# Patient Record
Sex: Female | Born: 1970 | Race: White | Hispanic: No | Marital: Married | Smoking: Former smoker
Health system: Southern US, Community
[De-identification: ages and names within clinical notes are randomized; demographics above are authoritative.]

---

## 2003-10-21 HISTORY — PX: CRANIOTOMY: SHX93

## 2018-03-16 ENCOUNTER — Ambulatory Visit
Admission: RE | Admit: 2018-03-16 | Discharge: 2018-03-16 | Disposition: A | Discharge status: Home / Self Care | Payer: BC Managed Care – PPO | Source: Ambulatory Visit | Attending: Nurse Practitioner | Admitting: Nurse Practitioner

## 2018-03-16 ENCOUNTER — Other Ambulatory Visit: Payer: Self-pay | Admitting: Nurse Practitioner

## 2018-03-16 DIAGNOSIS — M79672 Pain in left foot: Secondary | ICD-10-CM

## 2018-04-01 ENCOUNTER — Other Ambulatory Visit: Payer: Self-pay | Admitting: Nurse Practitioner

## 2018-04-01 ENCOUNTER — Ambulatory Visit
Admission: RE | Admit: 2018-04-01 | Discharge: 2018-04-01 | Disposition: A | Discharge status: Home / Self Care | Payer: BC Managed Care – PPO | Source: Ambulatory Visit | Attending: Nurse Practitioner | Admitting: Nurse Practitioner

## 2018-04-01 DIAGNOSIS — M79672 Pain in left foot: Secondary | ICD-10-CM

## 2018-09-21 ENCOUNTER — Other Ambulatory Visit: Payer: Self-pay | Admitting: Internal Medicine

## 2018-09-21 DIAGNOSIS — Z1231 Encounter for screening mammogram for malignant neoplasm of breast: Secondary | ICD-10-CM

## 2018-10-28 ENCOUNTER — Ambulatory Visit
Admission: RE | Admit: 2018-10-28 | Discharge: 2018-10-28 | Disposition: A | Discharge status: Home / Self Care | Payer: BC Managed Care – PPO | Source: Ambulatory Visit | Attending: Internal Medicine | Admitting: Internal Medicine

## 2018-10-28 DIAGNOSIS — Z1231 Encounter for screening mammogram for malignant neoplasm of breast: Secondary | ICD-10-CM

## 2019-01-04 IMAGING — DX DG FOOT COMPLETE 3+V*L*
3 series · 3 of 3 positions shown · non-contrast
Comparison: 03/16/2018

CLINICAL DATA: Follow-up fourth toe fracture, initial encounter

EXAM:
LEFT FOOT - COMPLETE 3+ VIEW

[dg foot complete left (1 of 3)]
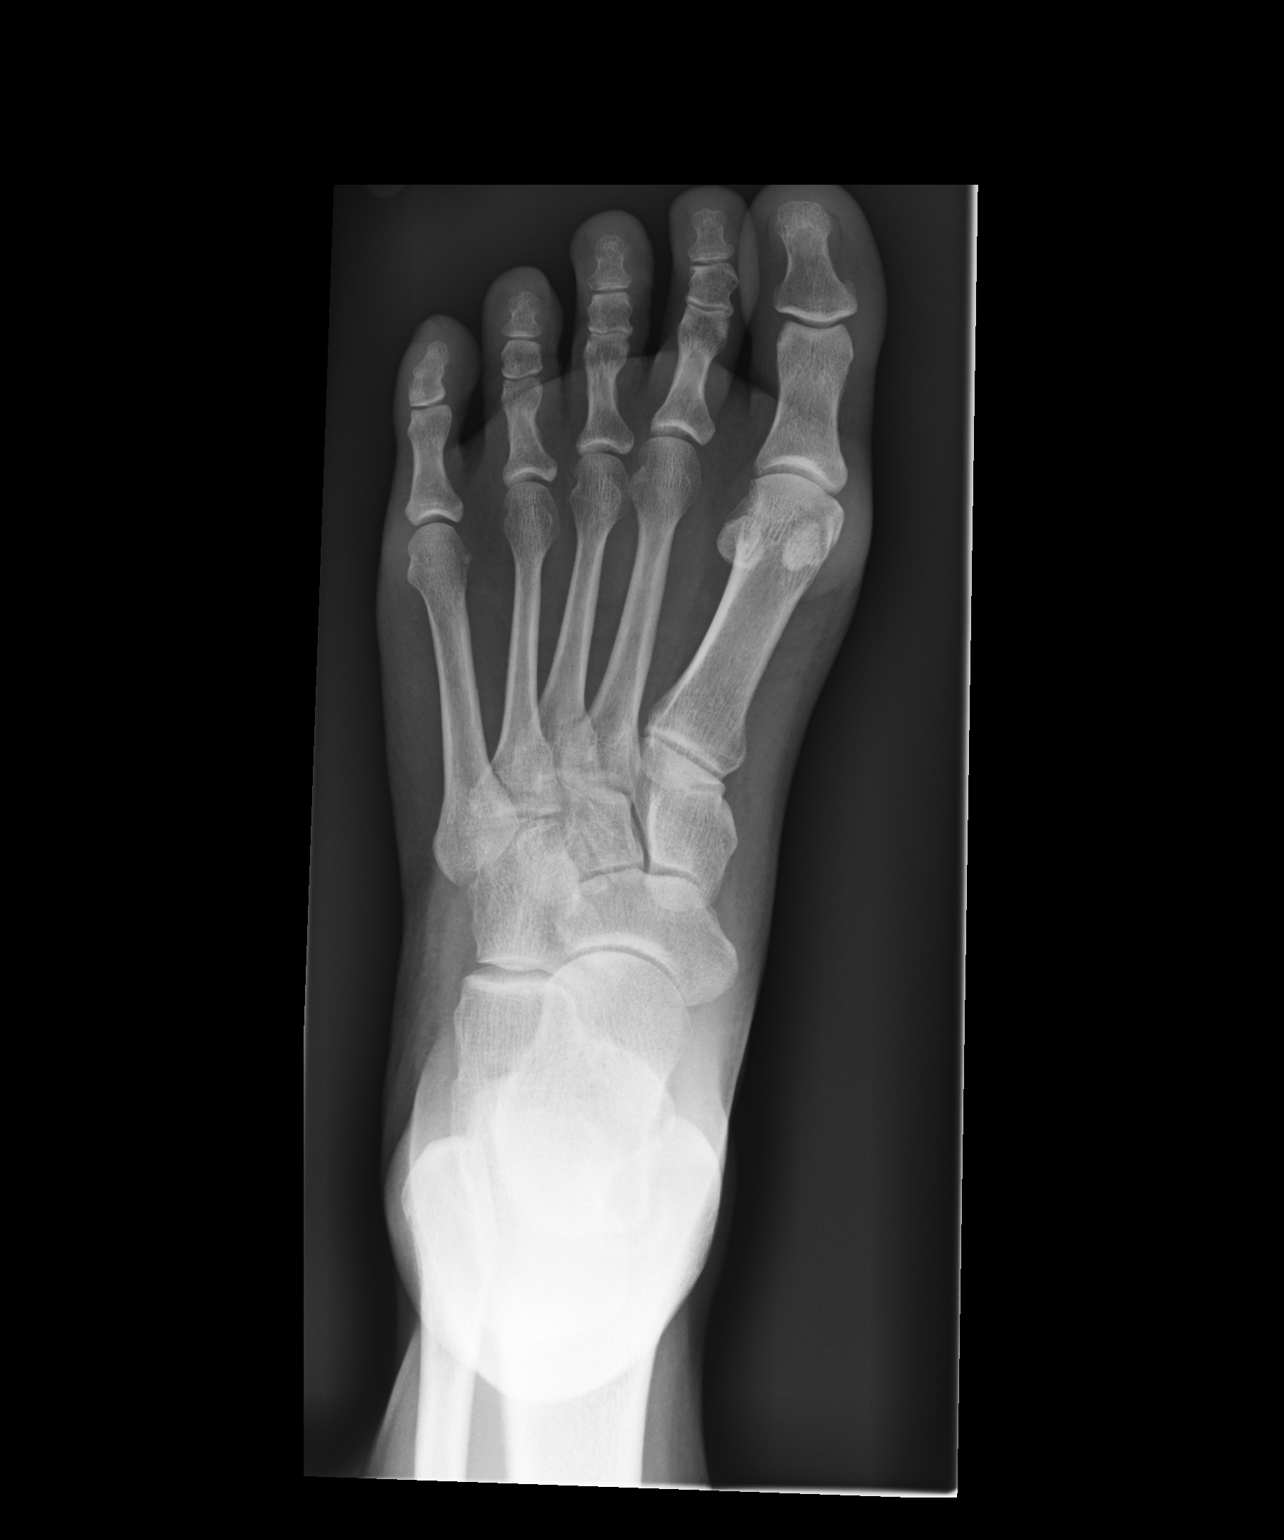

[dg foot complete left (2 of 3)]
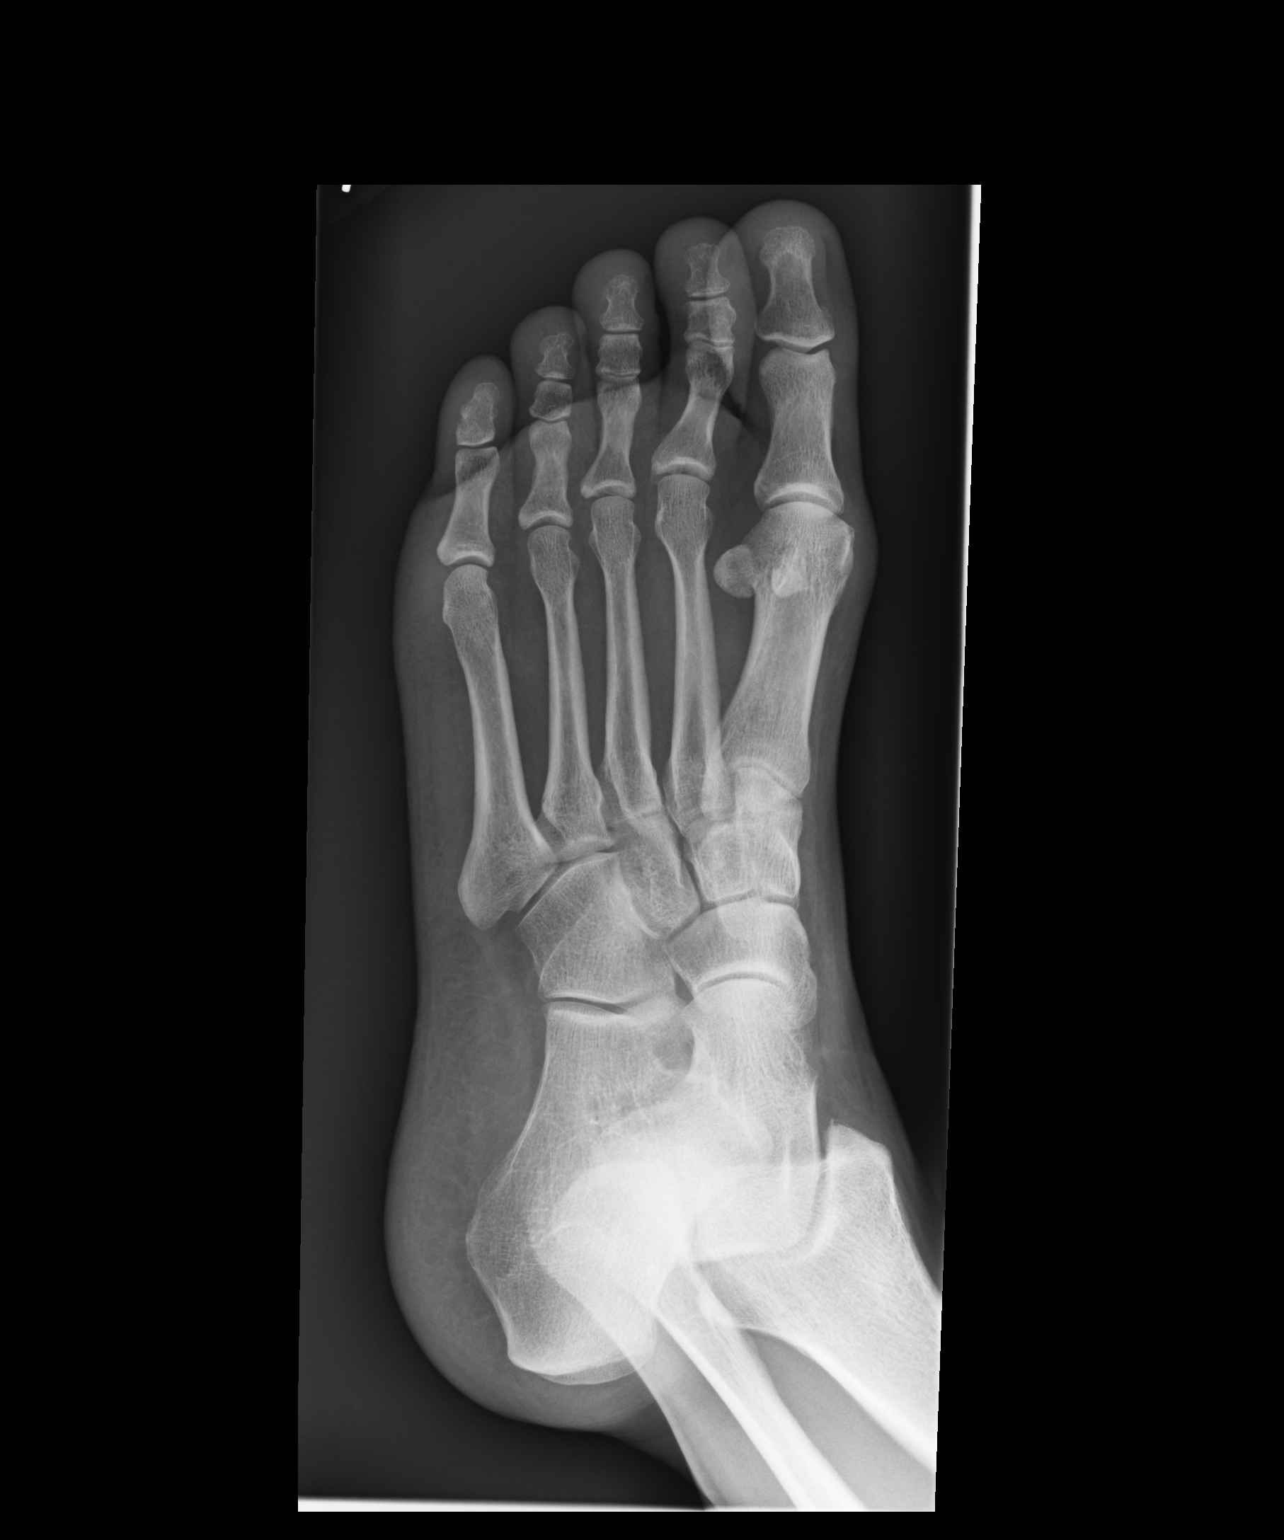

[dg foot complete left (3 of 3)]
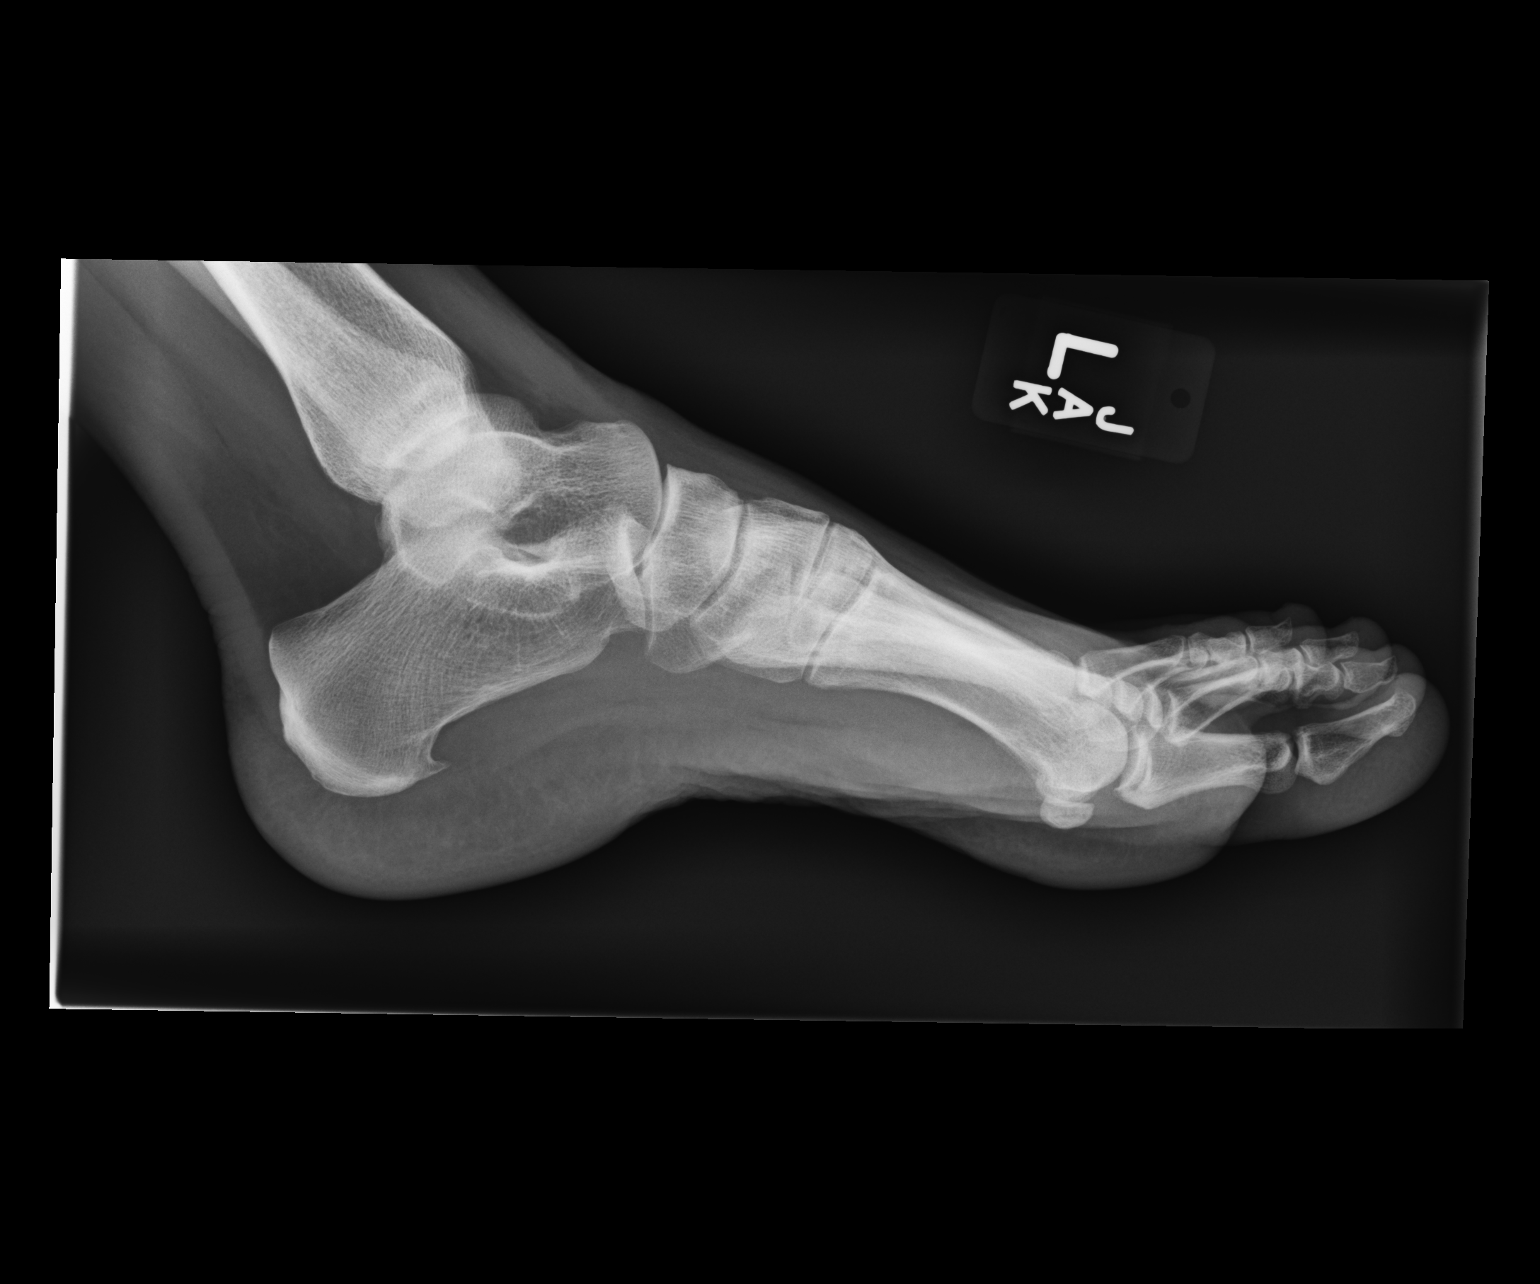

[3 of 3 positions shown; findings below may reference images not displayed]

FINDINGS: Previously seen avulsion from the fourth middle phalanx is faintly
identified. No new bony abnormality is seen. Mild calcaneal spurring
is noted.
IMPRESSION: Previously seen avulsion is again noted and stable.

## 2019-12-01 ENCOUNTER — Other Ambulatory Visit: Payer: Self-pay | Admitting: Obstetrics & Gynecology

## 2019-12-01 ENCOUNTER — Other Ambulatory Visit: Payer: Self-pay | Admitting: Internal Medicine

## 2019-12-01 DIAGNOSIS — Z1231 Encounter for screening mammogram for malignant neoplasm of breast: Secondary | ICD-10-CM

## 2019-12-06 ENCOUNTER — Other Ambulatory Visit: Payer: Self-pay

## 2019-12-06 ENCOUNTER — Ambulatory Visit
Admission: RE | Admit: 2019-12-06 | Discharge: 2019-12-06 | Disposition: A | Discharge status: Home / Self Care | Payer: BC Managed Care – PPO | Source: Ambulatory Visit | Attending: Obstetrics & Gynecology | Admitting: Obstetrics & Gynecology

## 2019-12-06 DIAGNOSIS — Z1231 Encounter for screening mammogram for malignant neoplasm of breast: Secondary | ICD-10-CM

## 2020-09-09 IMAGING — MG DIGITAL SCREENING BILAT W/ TOMO W/ CAD
8 series · 9 of 24 positions shown · non-contrast
Comparison: Previous exam(s).

CLINICAL DATA: Screening.

EXAM:
DIGITAL SCREENING BILATERAL MAMMOGRAM WITH TOMO AND CAD

[L MLO synth-2D]
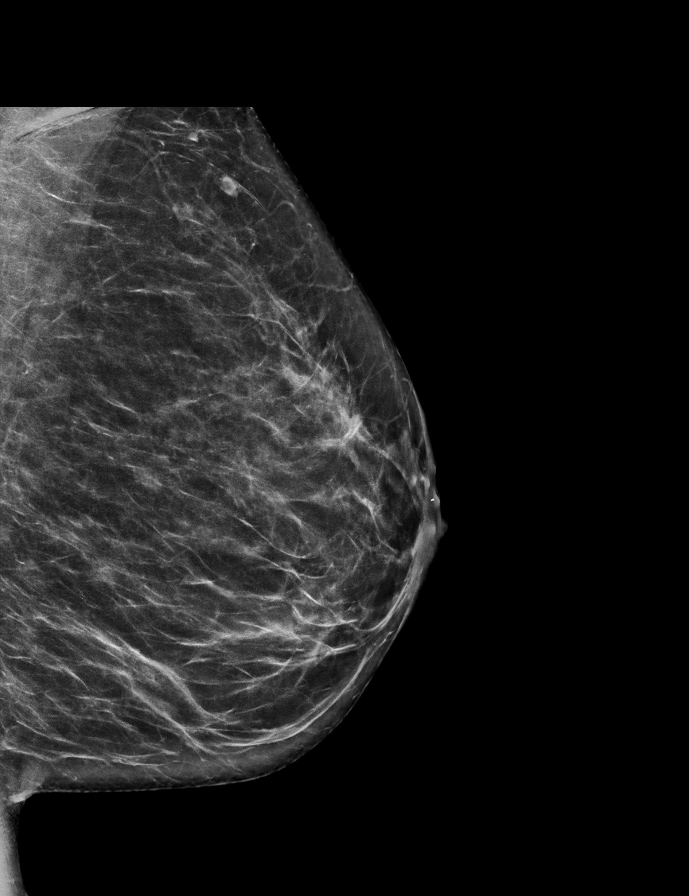

[R MLO synth-2D]
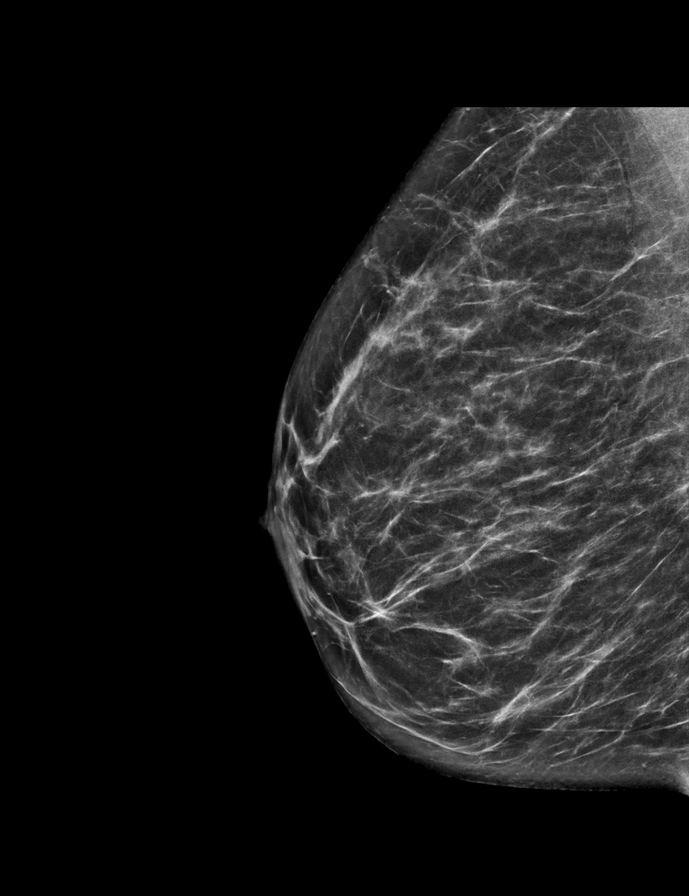

[L CC synth-2D]
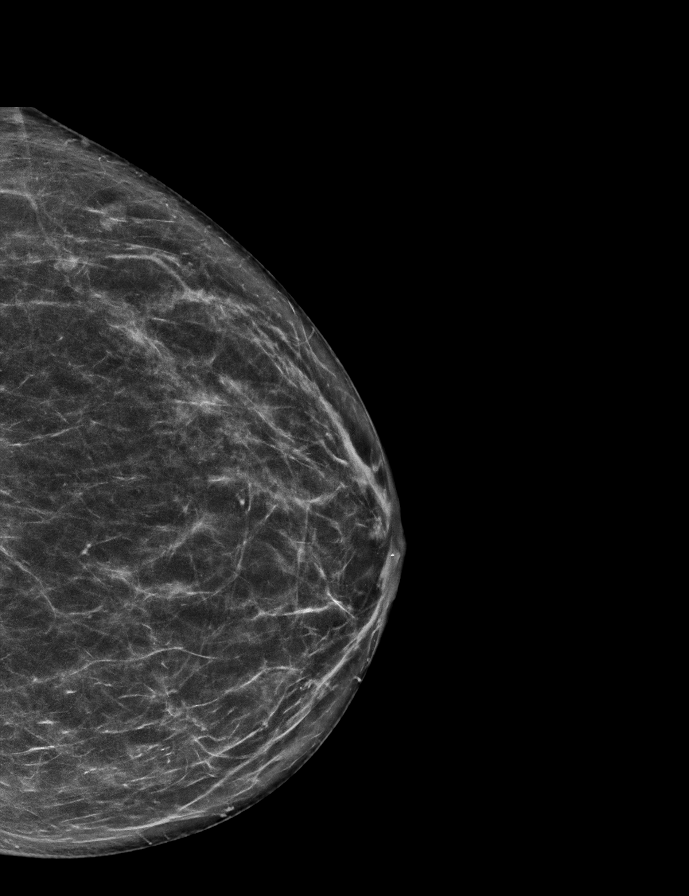

[R CC synth-2D]
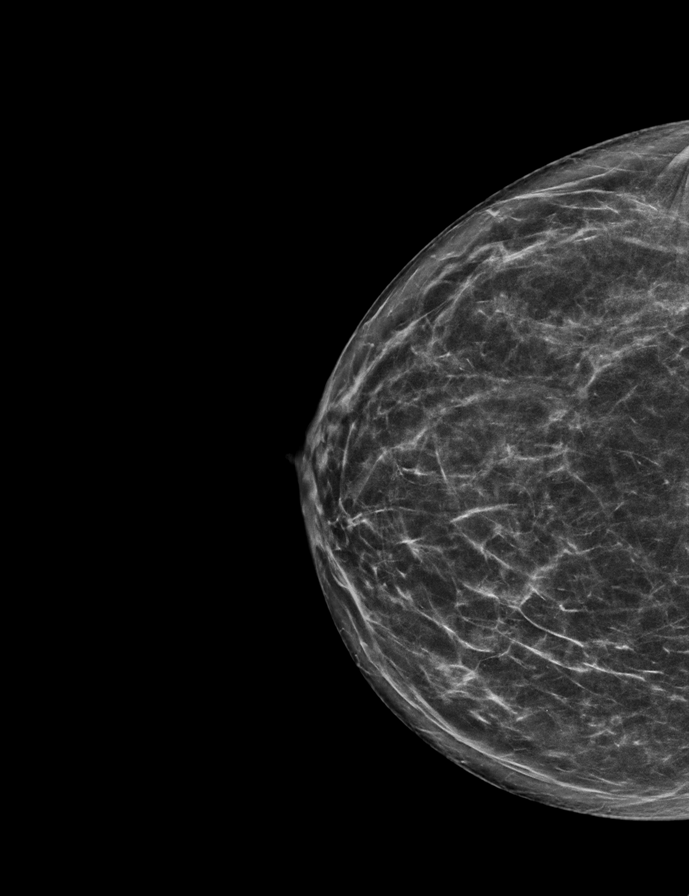

[L CC tomo · 2 of 62 frames shown]
[frame 21/62]
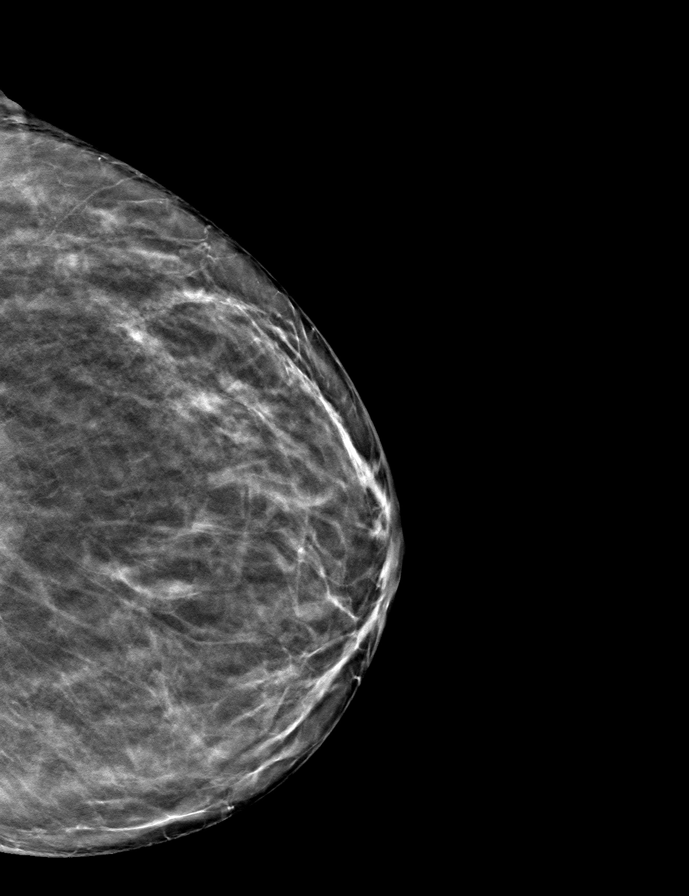
[frame 31/62]
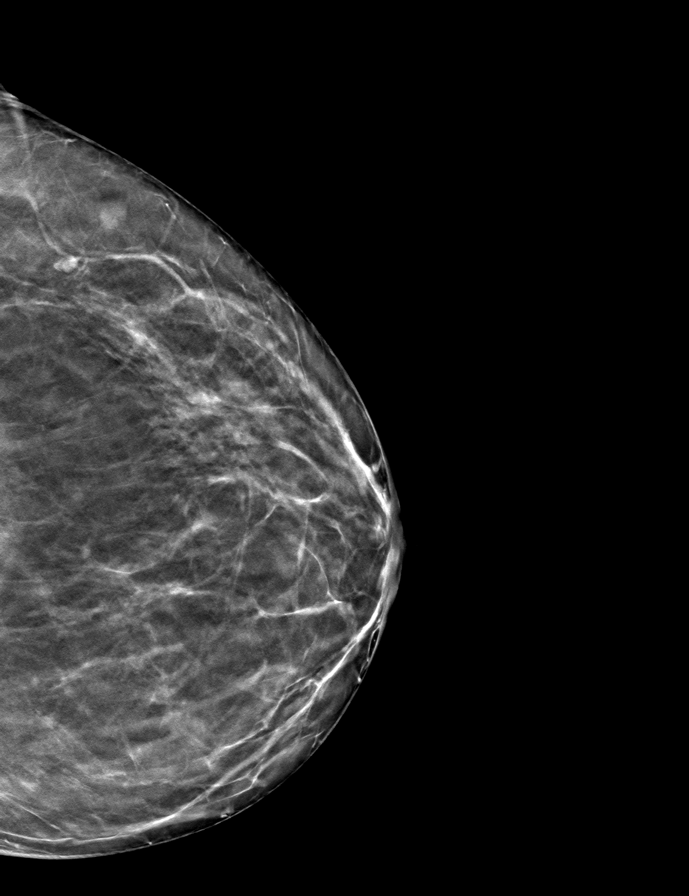

[R MLO tomo · tomo slice 31/61.0]
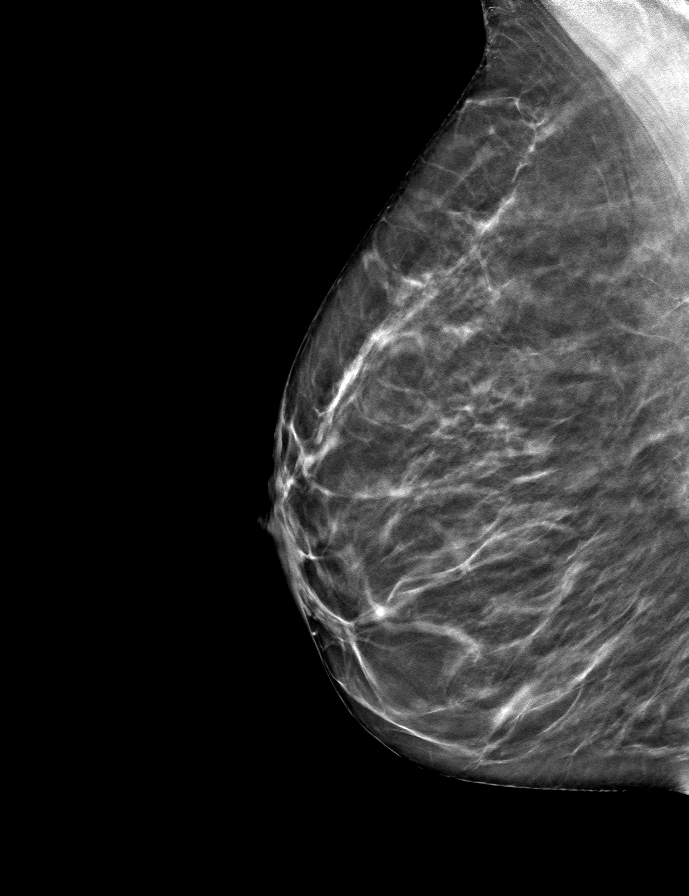

[R CC tomo · tomo slice 33/64.0]
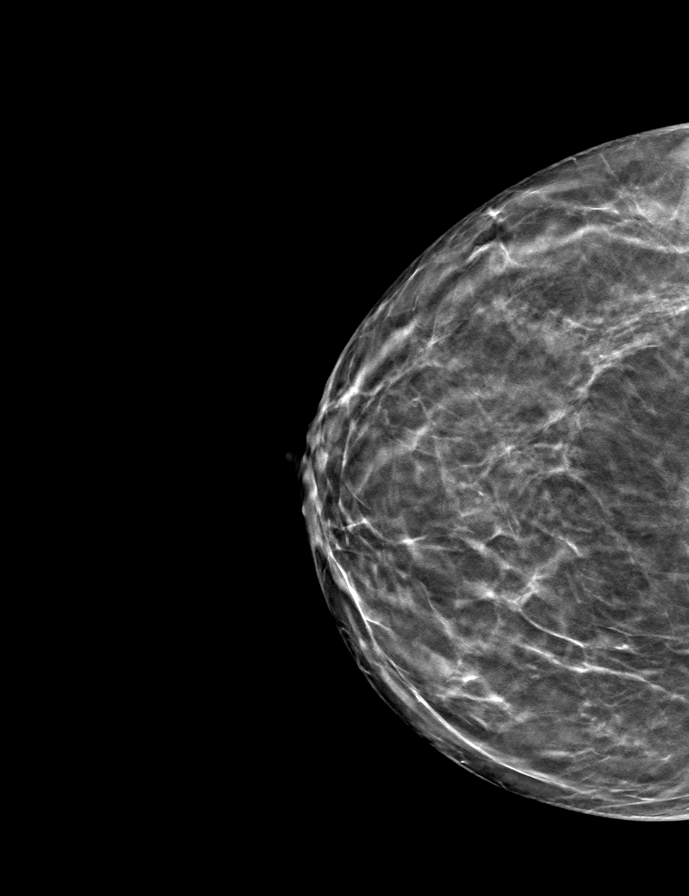

[L MLO tomo · tomo slice 33/65.0]
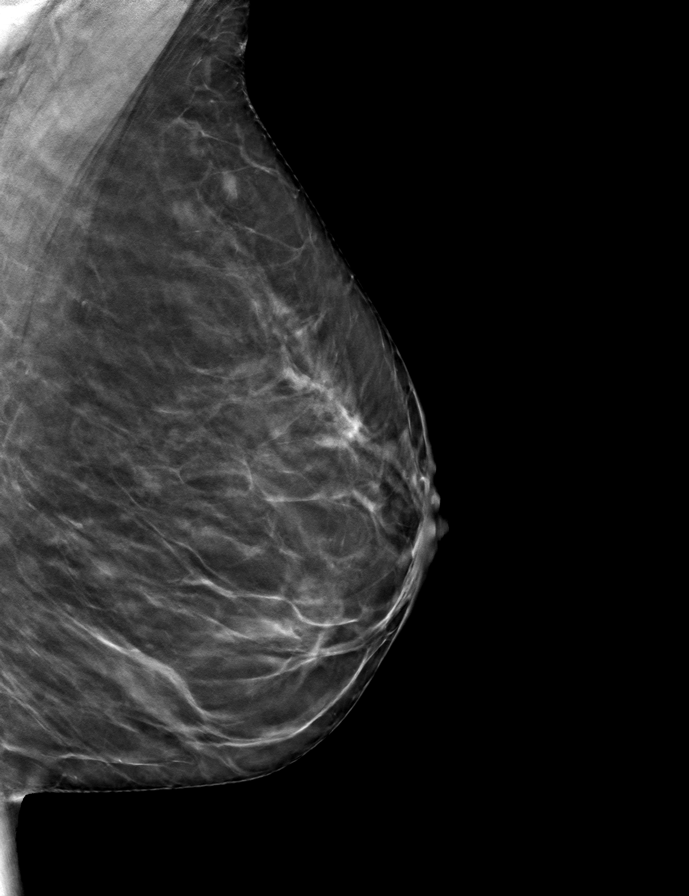

[9 of 24 positions shown; findings below may reference images not displayed]

ACR Breast Density Category b: There are scattered areas of
fibroglandular density.
FINDINGS: There are no findings suspicious for malignancy. Images were
processed with CAD.
IMPRESSION: No mammographic evidence of malignancy. A result letter of this
screening mammogram will be mailed directly to the patient.

RECOMMENDATION:
Screening mammogram in one year. (Code:[id number])

BI-RADS CATEGORY  1: Negative.

## 2021-07-17 ENCOUNTER — Telehealth (HOSPITAL_COMMUNITY): Payer: Self-pay | Admitting: Psychiatry

## 2021-07-17 ENCOUNTER — Ambulatory Visit (HOSPITAL_COMMUNITY)
Admission: EM | Admit: 2021-07-17 | Discharge: 2021-07-17 | Disposition: A | Discharge status: Home / Self Care | Payer: BC Managed Care – PPO

## 2021-07-17 ENCOUNTER — Encounter (HOSPITAL_COMMUNITY): Payer: Self-pay | Admitting: Registered Nurse

## 2021-07-17 ENCOUNTER — Other Ambulatory Visit: Payer: Self-pay

## 2021-07-17 DIAGNOSIS — F4322 Adjustment disorder with anxiety: Secondary | ICD-10-CM | POA: Diagnosis not present

## 2021-07-17 NOTE — Telephone Encounter (Signed)
D: Shuvon Rankin, NP referred pt for outpatient services (ie. MH-IOP or individual counseling).  A:  Placed call to discuss MH-IOP if pt is interested or transfer her to the front desk for appt, but there was no answer.  Left vm for pt to call cm or front desk back.  Inform Shuvon Rankin, NP.

## 2021-07-17 NOTE — Progress Notes (Signed)
Pt is discharged after being triaged and was seen by a provider.

## 2021-07-17 NOTE — BH Assessment (Signed)
Connie [name], Routine MR# [id number]: 50 years old presents this date alone.  Pt denied SI, HI, or AVH.  Pt reports crying and feeling anxious for a week.  Patient denies any prior MH diagnose or prescribed medication for symptom management.  MSE signed.

## 2021-07-17 NOTE — ED Notes (Signed)
Pt stated her anxiety is high and she having a hard time coping since her daughter got put in the hospital.

## 2021-07-17 NOTE — ED Provider Notes (Signed)
[facility] Medical Screening Exam  Patient Name: Connie [name] MRN: [id number] Date of Evaluation: 07/17/21 Chief Complaint:   Diagnosis:  Final diagnoses:  Adjustment disorder with anxiety    History of Present illness: Connie [name] is a 50 y.o. female patient presented to [facility] as a walk in with complaints of worsening anxiety and insomnia  Connie [name], 50 y.o., female patient seen face to face by this provider, consulted with Dr. Earlene Plater; and chart reviewed on 07/17/21.  On evaluation Connie [name] reports patient reports she has had worsening anxiety since her daughter has been admitted to psychiatric hospital.  Patient states she has not been able to sleep.  Reports she has tried Benadryl but it makes her too tired the next day and she is also tried CBD but it only helps her sleep for a while.  Patient denies suicidal/self-harm/homicidal ideation, psychosis, paranoia.  Reports she is just looking for some assistance for medication to help her sleep.  Reports she has talked to her primary care provider that he does not do short-term medications for anxiety or sleep.  Patient also reports that her daughter that is admitted at [facility] behavioral health is a patient of Dr. Maggie Schwalbe.  Informed patient since her daughter was a patient she may be able to call Dr. Maggie Schwalbe to see if she is able to get in with him or if he can provide her with her prescription until she is able to see them.  Also discussed outpatient psychiatric services for IOP or PHP and if not interested to fill neither or appropriate for level of care could possibly do outpatient psychiatric services with counseling.  Informed patient we will send her information to call behavioral health and have someone call to discuss services and schedule intake for her.  Patient in agreement.  Patient unable to stay had to leave to go pick up her daughter in high school who has autism and is afraid  to ride the bus. During evaluation Connie [name] is standing in no acute distress.  She is alert/oriented x 4; calm/cooperative; and mood congruent with affect.  She is speaking in a clear tone at moderate volume, and normal pace; with good eye contact.  Hi thought process is coherent and relevant; There is no indication that she is currently responding to internal/external stimuli or experiencing delusional thought content; and she has denied suicidal/self-harm/homicidal ideation, psychosis, and paranoia.   Patient has remained calm throughout assessment and has answered questions appropriately.    At this time Connie [name] is educated and verbalizes understanding of mental health resources and other crisis services in the community. She is instructed to call 911 and present to the nearest emergency room should she experience any suicidal/homicidal ideation, auditory/visual/hallucinations, or detrimental worsening of her mental health condition. Psychiatric Specialty Exam  Presentation  General Appearance:Appropriate for Environment  Eye Contact:Good  Speech:Clear and Coherent; Normal Rate  Speech Volume:Decreased  Handedness:No data recorded  Mood and Affect  Mood:Anxious  Affect:Congruent; Tearful   Thought Process  Thought Processes:Coherent; Goal Directed  Descriptions of Associations:Intact  Orientation:Full (Time, Place and Person)  Thought Content:No data recorded   Hallucinations:None  Ideas of Reference:None  Suicidal Thoughts:No  Homicidal Thoughts:No   Sensorium  Memory:Immediate Good; Recent Good; Remote Good  Judgment:Intact  Insight:Present   Executive Functions  Concentration: No data recorded Attention Span:Good  Recall:Good  Fund of Knowledge:Good  Language:Good   Psychomotor Activity  Psychomotor Activity:Normal  Assets  Assets:Communication Skills; Desire for Improvement; Financial Resources/Insurance; Housing; Social  Support   Sleep  Sleep:Fair  Number of hours: 0 (Patient states she hasn't been able to sleep; has tried CBD and Benadryl but not helping.)   Nutritional Assessment (For OBS and [facility]BC admissions only) Has the patient had a weight loss or gain of 10 pounds or more in the last 3 months?: No Has the patient had a decrease in food intake/or appetite?: No Does the patient have dental problems?: No Does the patient have eating habits or behaviors that may be indicators of an eating disorder including binging or inducing vomiting?: No Has the patient recently lost weight without trying?: 0 Has the patient been eating poorly because of a decreased appetite?: 0 Malnutrition Screening Tool Score: 0    Physical Exam: Physical Exam Vitals and nursing note reviewed. Exam conducted with a chaperone present.  Constitutional:      General: She is not in acute distress.    Appearance: Normal appearance. She is not ill-appearing.  Cardiovascular:     Rate and Rhythm: Normal rate.  Pulmonary:     Effort: Pulmonary effort is normal.  Musculoskeletal:        General: Normal range of motion.     Cervical back: Normal range of motion.  Skin:    General: Skin is warm and dry.  Neurological:     Mental Status: She is alert and oriented to person, place, and time.  Psychiatric:        Attention and Perception: Attention and perception normal. She does not perceive auditory or visual hallucinations.        Mood and Affect: Mood is anxious and depressed. Affect is tearful.        Speech: Speech normal.        Behavior: Behavior normal. Behavior is cooperative.        Thought Content: Thought content normal. Thought content is not paranoid or delusional. Thought content does not include homicidal or suicidal ideation.        Cognition and Memory: Cognition and memory normal.        Judgment: Judgment normal.   Review of Systems  Constitutional: Negative.   HENT: Negative.    Eyes: Negative.    Respiratory: Negative.    Cardiovascular: Negative.   Gastrointestinal: Negative.   Genitourinary: Negative.   Musculoskeletal: Negative.   Skin: Negative.   Neurological: Negative.   Endo/Heme/Allergies: Negative.   Psychiatric/Behavioral:  Negative for memory loss and substance abuse. Depression: Stable. Hallucinations: Denies. Suicidal ideas: Denies.The patient is nervous/anxious and has insomnia.   Blood pressure (!) 150/90, [name] (!) 105, temperature 98.4 F (36.9 C), temperature source Oral, resp. rate 18, SpO2 100 %. There is no height or weight on file to calculate BMI.  Musculoskeletal: Strength & Muscle Tone: within normal limits Gait & Station: normal Patient leans: N/A   [facility] MSE Discharge Disposition for Follow up and Recommendations: Based on my evaluation the patient does not appear to have an emergency medical condition and can be discharged with resources and follow up care in outpatient services for Medication Management, Partial Hospitalization Program, and Individual Therapy   Follow-up Information     [facility]   Specialty: Behavioral Health Why: Referral sent in for outpatient therapy.  If you don't hear anything by 10:00 tomorrow call the above number to set up services Contact information: [address] [place] [address] [phone]  [facility]   Why: Call Dr. Maggie Schwalbe to see if he can work you in since your daughter is a patient he may work with you Contact information: [address] [place] [address] [phone]                    Assunta Found, NP 07/17/2021, 2:28 PM

## 2021-07-24 ENCOUNTER — Telehealth (HOSPITAL_COMMUNITY): Payer: Self-pay | Admitting: Internal Medicine

## 2021-07-24 NOTE — BH Assessment (Signed)
Care Management - Follow Up [facility] Discharges   Writer left a HIPPA compliant voice mail.    Writer left name and phone number for the patent to call back if further assistance is needed in scheduling a follow up appointment with an outpatient provider.   Per chart review, patient was referred pt for outpatient services (ie. MH-IOP or individual counseling).

## 2021-08-30 ENCOUNTER — Other Ambulatory Visit: Payer: Self-pay

## 2021-08-30 ENCOUNTER — Ambulatory Visit: Payer: BC Managed Care – PPO | Admitting: Cardiology

## 2021-08-30 ENCOUNTER — Encounter: Payer: Self-pay | Admitting: Cardiology

## 2021-08-30 VITALS — BP 142/91 | HR 78 | Temp 98.2°F | Ht 65.0 in | Wt 135.6 lb

## 2021-08-30 DIAGNOSIS — R0989 Other specified symptoms and signs involving the circulatory and respiratory systems: Secondary | ICD-10-CM

## 2021-08-30 DIAGNOSIS — I493 Ventricular premature depolarization: Secondary | ICD-10-CM

## 2021-08-30 DIAGNOSIS — Z8249 Family history of ischemic heart disease and other diseases of the circulatory system: Secondary | ICD-10-CM

## 2021-08-30 NOTE — Progress Notes (Signed)
Primary Physician/Referring:  Charlane Ferretti, DO  Patient ID: Connie [name], female    DOB: 06-May-1971, 50 y.o.   MRN: [id number]  Chief Complaint  Patient presents with   Family history of Sudden Cardiac Death    New Patient (Initial Visit)   HPI:    Connie [name]  is a 50 y.o. 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  She is essentially asymptomatic, was diagnosed with PVCs several years ago but no evaluation was performed.  She has 1 sibling who is a sister that is alive who is healthy, parents do not have any history of sudden cardiac death or premature coronary artery disease.  History reviewed. No pertinent past medical history. Past Surgical History:  Procedure Laterality Date   CRANIOTOMY  2005   Family History  Problem Relation Age of Onset   Diabetes Mother    Transient ischemic attack Mother    Prostate cancer Father    Heart attack Brother 6       Alcoholic, substance use, tobacco use.  CAD confirmed by autopsy   Breast cancer Maternal Aunt     Social History   Tobacco Use   Smoking status: Former    Packs/day: 0.50    Years: 10.00    Pack years: 5.00    Types: Cigarettes    Quit date: 1999    Years since quitting: 23.8   Smokeless tobacco: Never  Substance Use Topics   Alcohol use: Yes    Alcohol/week: 18.0 standard drinks    Types: 18 Glasses of wine per week    Comment: wine after work   Marital Status: Married  ROS  Review of Systems  Cardiovascular:  Negative for chest pain, dyspnea on exertion and leg swelling.  Gastrointestinal:  Negative for melena.  Objective  Blood pressure (!) 142/91, [name] 78, temperature 98.2 F (36.8 C), height 5\' 5"  (1.651 m), weight 135 lb 9.6 oz (61.5 kg), SpO2 99 %. Body mass index is 22.57 kg/m.  Vitals with BMI 08/30/2021 08/30/2021   Height - 5\' 5"   Weight - 135 lbs 10 oz  BMI - 22.57  Systolic 142 147  Diastolic 91 103  [name] 78 67  Some encounter information is confidential and restricted. Go to Review Flowsheets activity to see all data.    Physical Exam Neck:     Vascular: No carotid bruit or JVD.  Cardiovascular:     Rate and Rhythm: Normal rate and regular rhythm. FrequentExtrasystoles are present.    Pulses: Intact distal pulses.     Heart sounds: Normal heart sounds. No murmur heard.   No gallop.  Pulmonary:     Effort: Pulmonary effort is normal.     Breath sounds: Normal breath sounds.  Abdominal:     General: Bowel sounds are normal.     Palpations: Abdomen is soft.  Musculoskeletal:        General: No swelling.     Laboratory examination:   External labs:   Labs 08/16/2021: Total cholesterol 189, triglycerides 73, HDL 91, LDL 83.  Non-HDL cholesterol 98. Hemoglobin 14.800 g/d 12/19/2020 Platelets 245  TSH normal at 1.29  Creatinine, Serum 0.830 mg/ 12/19/2020 Potassium 4.100 mm 12/19/2020 Serum glucose 101 mg ALT (SGPT) 15.000 U/L 12/19/2020 Vitamin D 26.8  Medications and allergies  No Known Allergies  Medication prior to this encounter:   Outpatient Medications Prior to Visit  Medication Sig Dispense Refill   Calcium Carb-Cholecalciferol 200-10 MG-MCG CAPS 1 tablet in the morning and at bedtime.     imipramine (TOFRANIL) 50 MG tablet Take 50 mg by mouth daily.     Magnesium 100 MG CAPS magnesium     Multiple Vitamin (MULTIVITAMIN ADULT PO) multivitamin     NON FORMULARY      Probiotic Product (PROBIOTIC-10) CHEW Probiotic     valACYclovir (VALTREX) 500 MG tablet valacyclovir 500 mg tablet  TAKE 1 TABLET BY MOUTH EVERY DAY     doxepin (SINEQUAN) 50 MG capsule      lamoTRIgine (LAMICTAL) 25 MG tablet      No facility-administered medications prior to visit.     Medication list after today's encounter   Current Outpatient Medications  Medication Instructions   Calcium  Carb-Cholecalciferol 200-10 MG-MCG CAPS 1 tablet, 2 times daily   imipramine (TOFRANIL) 50 mg, Oral, Daily   Magnesium 100 MG CAPS magnesium   Multiple Vitamin (MULTIVITAMIN ADULT PO) multivitamin   NON FORMULARY No dose, route, or frequency recorded.   Probiotic Product (PROBIOTIC-10) CHEW Probiotic   valACYclovir (VALTREX) 500 MG tablet valacyclovir 500 mg tablet  TAKE 1 TABLET BY MOUTH EVERY DAY    Radiology:   No results found.  Cardiac Studies:   na  EKG:   EKG 08/30/2021: Normal sinus rhythm at rate of 87 bpm, left atrial enlargement, normal axis, incomplete right bundle branch block.  Frequent PVCs (4).  Abnormal EKG.    Assessment     ICD-10-CM   1. PVC (premature ventricular contraction)  I49.3 EKG 12-Lead    PCV ECHOCARDIOGRAM COMPLETE    PCV CARDIAC STRESS TEST    2. Family history of coronary artery disease, brother with ASCVD and death at 50-substance use  Z82.49     3. 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  Orders Placed This Encounter  Procedures   PCV CARDIAC STRESS TEST    Standing Status:   Future    Standing Expiration Date:   10/30/2021   EKG 12-Lead   PCV ECHOCARDIOGRAM COMPLETE    Standing Status:   Future    Standing Expiration Date:   08/30/2022   Recommendations:   Connie [name] is a 50 y.o. 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  She is essentially asymptomatic.  We were able to call her diet and get more details on her brother's history, patient's brother was an alcoholic, polysubstance user including  cocaine and heroin, smoking cigarettes.  His cause of death was found to be atherosclerotic coronary artery disease.  However in view of her frequent PVCs and she has not been evaluated in the past, I will set her up for a echocardiogram and also routine treadmill exercise stress test and I would like to see her back in 4 to 6 weeks for follow-up.  Patient has been drinking excessively, she drinks anywhere from 2 to 3 glasses mostly  3 glasses of wine every day.  I have strongly urged her to abstain from drinking alcohol especially in view of family history of alcoholism in her brother.  Her blood pressure was also markedly elevated today, in fact when I rechecked it it was 160/100 mmHg.  Suspect this may be related to alcohol use.  As she is a physical therapist, while in the hospital she will keep an eye on her blood pressure record.  While reviewing her chart, lipids under excellent control, at one point she has had elevated triglycerides and I again suspect it may have been related to excess alcohol use.  Otherwise she did not make any changes to her medications and I will make final recommendation upon her next office visit.    Connie Decamp, MD, [facility] 08/30/2021, 10:13 AM Office: [phone]

## 2021-09-27 ENCOUNTER — Other Ambulatory Visit: Payer: Self-pay

## 2021-09-27 ENCOUNTER — Ambulatory Visit: Payer: BC Managed Care – PPO

## 2021-09-27 DIAGNOSIS — I493 Ventricular premature depolarization: Secondary | ICD-10-CM

## 2021-09-27 DIAGNOSIS — R0989 Other specified symptoms and signs involving the circulatory and respiratory systems: Secondary | ICD-10-CM

## 2021-10-10 ENCOUNTER — Encounter: Payer: Self-pay | Admitting: Cardiology

## 2021-10-10 ENCOUNTER — Ambulatory Visit: Payer: BC Managed Care – PPO | Admitting: Cardiology

## 2021-10-10 ENCOUNTER — Other Ambulatory Visit: Payer: Self-pay

## 2021-10-10 VITALS — BP 123/74 | HR 59 | Temp 98.5°F | Resp 17 | Ht 65.0 in | Wt 133.0 lb

## 2021-10-10 DIAGNOSIS — I493 Ventricular premature depolarization: Secondary | ICD-10-CM

## 2021-10-10 DIAGNOSIS — R0989 Other specified symptoms and signs involving the circulatory and respiratory systems: Secondary | ICD-10-CM

## 2021-10-10 NOTE — Progress Notes (Signed)
Primary Physician/Referring:  Charlane Ferretti, DO  Patient ID: Connie [name], female    DOB: 05/05/1971, 50 y.o.   MRN: [id number]  Chief Complaint  Patient presents with   Follow-up    6 WEEKS   FREQUENT PVCS   Hypertension   HPI:    Connie [name]  is a 50 y.o. Caucasian female patient who is a physical therapist by profession, referred to me for evaluation of family history of sudden cardiac death in her brother at age 64 and also frequent PVCs noted on the EKG.  She is essentially asymptomatic, continues to be physically active and also plays frequent tennis without any difficulty.  We were able to call her dad and get more details on her brother's history, patient's brother was an alcoholic, polysubstance user including cocaine and heroin, smoking cigarettes.  His cause of death was found to be atherosclerotic coronary artery disease.  In view of frequent PVCs, elevated blood pressure, I performed echocardiogram and also routine treadmill exercise stress test and she now presents for follow-up.   History reviewed. No pertinent past medical history. Past Surgical History:  Procedure Laterality Date   CRANIOTOMY  2005   Family History  Problem Relation Age of Onset   Diabetes Mother    Transient ischemic attack Mother    Prostate cancer Father    Heart disease Brother    Heart attack Brother 30       Alcoholic, substance use, tobacco use.  CAD confirmed by autopsy   Cirrhosis Brother    Breast cancer Maternal Aunt     Social History   Tobacco Use   Smoking status: Former    Packs/day: 0.50    Years: 10.00    Pack years: 5.00    Types: Cigarettes    Quit date: 1999    Years since quitting: 23.9   Smokeless tobacco: Never  Substance Use Topics   Alcohol use: Yes    Alcohol/week: 10.0 standard drinks    Types: 10 Glasses of wine per week    Comment: wine after work   Marital Status: Married  ROS  Review of Systems  Cardiovascular:  Negative for chest  pain, dyspnea on exertion and leg swelling.  Gastrointestinal:  Negative for melena.  Objective  Blood pressure 123/74, [name] (!) 59, temperature 98.5 F (36.9 C), temperature source Temporal, resp. rate 17, height 5\' 5"  (1.651 m), weight 133 lb (60.3 kg), SpO2 98 %. Body mass index is 22.13 kg/m.  Vitals with BMI 10/10/2021 08/30/2021 08/30/2021  Height 5\' 5"  - 5\' 5"   Weight 133 lbs - 135 lbs 10 oz  BMI 22.13 - 22.57  Systolic 123 142 13/08/2021  Diastolic 74 91 103  [name] 59 78 67  Some encounter information is confidential and restricted. Go to Review Flowsheets activity to see all data.    Physical Exam Neck:     Vascular: No carotid bruit or JVD.  Cardiovascular:     Rate and Rhythm: Normal rate and regular rhythm. FrequentExtrasystoles are present.    Pulses: Intact distal pulses.     Heart sounds: Normal heart sounds. No murmur heard.   No gallop.  Pulmonary:     Effort: Pulmonary effort is normal.     Breath sounds: Normal breath sounds.  Abdominal:     General: Bowel sounds are normal.     Palpations: Abdomen is soft.  Musculoskeletal:        General: No swelling.     Laboratory examination:  External labs:   Labs 08/16/2021: Total cholesterol 189, triglycerides 73, HDL 91, LDL 83.  Non-HDL cholesterol 98. Hemoglobin 14.800 g/d 12/19/2020 Platelets 245  TSH normal at 1.29  Creatinine, Serum 0.830 mg/ 12/19/2020 Potassium 4.100 mm 12/19/2020 Serum glucose 101 mg ALT (SGPT) 15.000 U/L 12/19/2020 Vitamin D 26.8  Medications and allergies  No Known Allergies   Medication prior to this encounter:   Outpatient Medications Prior to Visit  Medication Sig Dispense Refill   Calcium Carb-Cholecalciferol 200-10 MG-MCG CAPS 1 tablet in the morning and at bedtime.     imipramine (TOFRANIL) 50 MG tablet Take 50 mg by mouth daily.     Magnesium 100 MG CAPS magnesium     Multiple Vitamin (MULTIVITAMIN ADULT PO) multivitamin     valACYclovir (VALTREX) 500 MG tablet Take 500 mg  by mouth as needed.     Probiotic Product (PROBIOTIC-10) CHEW Probiotic (Patient not taking: Reported on 10/10/2021)     NON FORMULARY      No facility-administered medications prior to visit.     Medication list after today's encounter   Current Outpatient Medications  Medication Instructions   Calcium Carb-Cholecalciferol 200-10 MG-MCG CAPS 1 tablet, 2 times daily   imipramine (TOFRANIL) 50 mg, Oral, Daily   Magnesium 100 MG CAPS magnesium   Multiple Vitamin (MULTIVITAMIN ADULT PO) multivitamin   Probiotic Product (PROBIOTIC-10) CHEW Probiotic   valACYclovir (VALTREX) 500 mg, Oral, As needed    Radiology:   No results found.  Cardiac Studies:   PCV ECHOCARDIOGRAM COMPLETE 09/27/2021  Narrative Echocardiogram 09/27/2021: Normal LV systolic function with visual EF 55-60%. Left ventricle cavity is normal in size. Normal left ventricular wall thickness. Normal global wall motion. Normal diastolic filling pattern, normal LAP. Left atrial cavity is normal in size. A small size patent foramen ovale cannot be rule out. No significant valvular heart disease. No prior study for comparison.    [facility] CARDIAC STRESS TEST 09/27/2021  Narrative Exercise treadmill stress test 09/27/2021: Functional status: Fair. Chest pain: No. Reason for stopping exercise: Fatigue/weakness. Hypertensive response to exercise: No. Exercise time 8 minutes 30 seconds on Bruce protocol, achieved 10.16 METS, 90% of age-predicted maximum heart rate. Rare PVCs at rest, exercise, and recovery. Stress ECG nondiagnostic for ischemia due to resting ST abnormalities. Consider further cardiac work-up if clinically indicated.   EKG:   EKG 08/30/2021: Normal sinus rhythm at rate of 87 bpm, left atrial enlargement, normal axis, incomplete right bundle branch block.  Frequent PVCs (4).  Abnormal EKG.    Assessment     ICD-10-CM   1. PVC (premature ventricular contraction)  I49.3     2. Labile hypertension   R09.89        Medications Discontinued During This Encounter  Medication Reason   NON FORMULARY     No orders of the defined types were placed in this encounter.   No orders of the defined types were placed in this encounter.  Recommendations:   Connie [name] is a 50 y.o. 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  She is essentially asymptomatic, continues to be physically active and also plays frequent tennis without any difficulty.  We were able to call her dad and get more details on her brother's history, patient's brother was an alcoholic, polysubstance user including cocaine and heroin, smoking cigarettes.  His  cause of death was found to be atherosclerotic coronary artery disease.  In view of frequent PVCs, elevated blood pressure, I performed echocardiogram and also routine treadmill exercise stress test and she now presents for follow-up.  Given normal LVEF and no significant structural abnormality by echocardiogram, PVCs may be inconsequential.  Since last office visit, she has reduced her alcohol intake from 3 glasses of wine a day to 1 glass of wine a day and since then blood pressure has normalized.  I have advised her to follow-up with me in 1 year to follow-up on PVCs, I may consider repeating echocardiogram at that time as she has frequent PVCs, if there is change in LVEF, could consider 7 days of outpatient EKG monitoring for evaluation of PVC burden.   Yates Decamp, MD, [facility] 10/10/2021, 4:55 PM Office: [phone]
